# Patient Record
Sex: Female | Born: 1970 | Race: White | Hispanic: No | Smoking: Never smoker
Health system: Southern US, Community
[De-identification: ages and names within clinical notes are randomized; demographics above are authoritative.]

## PROBLEM LIST (undated history)

## (undated) DIAGNOSIS — E785 Hyperlipidemia, unspecified: Secondary | ICD-10-CM

## (undated) DIAGNOSIS — M1990 Unspecified osteoarthritis, unspecified site: Secondary | ICD-10-CM

## (undated) DIAGNOSIS — K219 Gastro-esophageal reflux disease without esophagitis: Secondary | ICD-10-CM

## (undated) DIAGNOSIS — T7840XA Allergy, unspecified, initial encounter: Secondary | ICD-10-CM

## (undated) DIAGNOSIS — I10 Essential (primary) hypertension: Secondary | ICD-10-CM

## (undated) HISTORY — DX: Gastro-esophageal reflux disease without esophagitis: K21.9

## (undated) HISTORY — DX: Hyperlipidemia, unspecified: E78.5

## (undated) HISTORY — DX: Essential (primary) hypertension: I10

## (undated) HISTORY — DX: Unspecified osteoarthritis, unspecified site: M19.90

## (undated) HISTORY — DX: Allergy, unspecified, initial encounter: T78.40XA

---

## 1998-11-22 ENCOUNTER — Other Ambulatory Visit: Admission: RE | Admit: 1998-11-22 | Discharge: 1998-11-22 | Payer: Self-pay | Admitting: Obstetrics and Gynecology

## 1999-11-28 ENCOUNTER — Other Ambulatory Visit: Admission: RE | Admit: 1999-11-28 | Discharge: 1999-11-28 | Payer: Self-pay | Admitting: Obstetrics and Gynecology

## 2001-01-09 ENCOUNTER — Other Ambulatory Visit: Admission: RE | Admit: 2001-01-09 | Discharge: 2001-01-09 | Payer: Self-pay | Admitting: Obstetrics and Gynecology

## 2002-01-13 ENCOUNTER — Other Ambulatory Visit: Admission: RE | Admit: 2002-01-13 | Discharge: 2002-01-13 | Payer: Self-pay | Admitting: Obstetrics and Gynecology

## 2003-11-02 ENCOUNTER — Inpatient Hospital Stay (HOSPITAL_COMMUNITY): Admission: AD | Admit: 2003-11-02 | Discharge: 2003-11-02 | Payer: Self-pay | Admitting: Obstetrics and Gynecology

## 2003-11-04 ENCOUNTER — Inpatient Hospital Stay (HOSPITAL_COMMUNITY): Admission: AD | Admit: 2003-11-04 | Discharge: 2003-11-04 | Payer: Self-pay | Admitting: Obstetrics and Gynecology

## 2003-11-07 ENCOUNTER — Inpatient Hospital Stay (HOSPITAL_COMMUNITY): Admission: AD | Admit: 2003-11-07 | Discharge: 2003-11-07 | Payer: Self-pay | Admitting: Obstetrics and Gynecology

## 2003-11-14 ENCOUNTER — Inpatient Hospital Stay (HOSPITAL_COMMUNITY): Admission: AD | Admit: 2003-11-14 | Discharge: 2003-11-19 | Payer: Self-pay | Admitting: Obstetrics and Gynecology

## 2003-12-17 HISTORY — PX: CHOLECYSTECTOMY: SHX55

## 2003-12-21 ENCOUNTER — Other Ambulatory Visit: Admission: RE | Admit: 2003-12-21 | Discharge: 2003-12-21 | Payer: Self-pay | Admitting: Obstetrics and Gynecology

## 2004-03-28 ENCOUNTER — Encounter: Admission: RE | Admit: 2004-03-28 | Discharge: 2004-03-28 | Payer: Self-pay | Admitting: Family Medicine

## 2004-03-28 ENCOUNTER — Inpatient Hospital Stay (HOSPITAL_COMMUNITY): Admission: EM | Admit: 2004-03-28 | Discharge: 2004-04-04 | Payer: Self-pay | Admitting: Emergency Medicine

## 2004-04-17 ENCOUNTER — Ambulatory Visit (HOSPITAL_COMMUNITY): Admission: RE | Admit: 2004-04-17 | Discharge: 2004-04-17 | Payer: Self-pay | Admitting: General Surgery

## 2005-01-29 ENCOUNTER — Other Ambulatory Visit: Admission: RE | Admit: 2005-01-29 | Discharge: 2005-01-29 | Payer: Self-pay | Admitting: Obstetrics and Gynecology

## 2011-06-25 ENCOUNTER — Other Ambulatory Visit: Payer: Self-pay | Admitting: Obstetrics and Gynecology

## 2014-03-09 ENCOUNTER — Other Ambulatory Visit: Payer: Self-pay | Admitting: Urology

## 2014-03-09 DIAGNOSIS — N361 Urethral diverticulum: Secondary | ICD-10-CM

## 2014-03-17 ENCOUNTER — Ambulatory Visit (HOSPITAL_COMMUNITY)
Admission: RE | Admit: 2014-03-17 | Discharge: 2014-03-17 | Disposition: A | Discharge status: Home / Self Care | Payer: BC Managed Care – PPO | Source: Ambulatory Visit | Attending: Urology | Admitting: Urology

## 2014-03-17 ENCOUNTER — Other Ambulatory Visit: Payer: Self-pay | Admitting: Urology

## 2014-03-17 DIAGNOSIS — N361 Urethral diverticulum: Secondary | ICD-10-CM | POA: Insufficient documentation

## 2014-03-17 DIAGNOSIS — F40298 Other specified phobia: Secondary | ICD-10-CM | POA: Insufficient documentation

## 2014-08-15 ENCOUNTER — Ambulatory Visit (INDEPENDENT_AMBULATORY_CARE_PROVIDER_SITE_OTHER): Payer: BC Managed Care – PPO | Admitting: Family Medicine

## 2014-08-15 ENCOUNTER — Encounter: Payer: Self-pay | Admitting: Family Medicine

## 2014-08-15 VITALS — BP 130/95 | Ht 65.0 in | Wt 243.0 lb

## 2014-08-15 DIAGNOSIS — I10 Essential (primary) hypertension: Secondary | ICD-10-CM

## 2014-08-15 DIAGNOSIS — M722 Plantar fascial fibromatosis: Secondary | ICD-10-CM

## 2014-08-15 DIAGNOSIS — M76829 Posterior tibial tendinitis, unspecified leg: Secondary | ICD-10-CM

## 2014-08-15 DIAGNOSIS — E669 Obesity, unspecified: Secondary | ICD-10-CM

## 2014-08-15 DIAGNOSIS — M76821 Posterior tibial tendinitis, right leg: Secondary | ICD-10-CM

## 2014-08-15 DIAGNOSIS — M25579 Pain in unspecified ankle and joints of unspecified foot: Secondary | ICD-10-CM

## 2014-08-15 DIAGNOSIS — M25571 Pain in right ankle and joints of right foot: Secondary | ICD-10-CM

## 2014-08-16 DIAGNOSIS — I10 Essential (primary) hypertension: Secondary | ICD-10-CM | POA: Insufficient documentation

## 2014-08-16 DIAGNOSIS — M25579 Pain in unspecified ankle and joints of unspecified foot: Secondary | ICD-10-CM | POA: Insufficient documentation

## 2014-08-16 DIAGNOSIS — M722 Plantar fascial fibromatosis: Secondary | ICD-10-CM | POA: Insufficient documentation

## 2014-08-16 DIAGNOSIS — M76821 Posterior tibial tendinitis, right leg: Secondary | ICD-10-CM | POA: Insufficient documentation

## 2014-08-16 NOTE — Progress Notes (Signed)
Patient ID: Molly Harvey, female   DOB: Jan 26, 1971, 43 y.o.   MRN: [id number]  Molly Harvey - 43 y.o. female MRN [id number]  Date of birth: [birth year]    SUBJECTIVE:     Patient sent here for evaluation for custom molded orthotics. She is seen the orthopedist for ankle and foot pain. She has bilateral plantar fasciitis diagnosed in on the right she's been diagnosed with right posterior tibialis tendinitis with a possible tear. She is currently in a fracture walker boot and will be in the next 2-3 weeks area  She has had long-standing bilateral plantar fascia type pain it's been worse in the last 6 months. 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 ROS:     No unusual weight change, no numbness in her feet or legs. No swelling in her lower extremities no calf pain. No fever, sweats, chills.  PERTINENT  PMH / PSH FH / / SH:  Past Medical, Surgical, Social, and Family History Reviewed & Updated in the EMR.  Pertinent findings include:  History of hypertension and then she's on 2 blood pressure medicines for that Obesity Pes planus Past surgical history includes cholecystectomy and C-section  OBJECTIVE: BP 130/95  Ht  (1.651 m)  Wt 243 lb (110.224 kg)  BMI 40.44 kg/m2  Physical Exam:  Vital signs are reviewed. GENERAL: Well-developed obese female no acute distress ANKLES: Bilaterally full range of motion with normal anterior drawer. The right posterior tibial tendon is mildly tender to palpation but does not sublux on active range of motion. FEET: Significant pes planus. The right foot has medial foot collapse inward. Right greater than left with bilateral plantar fascial origin tenderness to palpation. GAIT: Normal stride length.  Patient was fitted for a : standard, cushioned, semi-rigid orthotic. The orthotic was heated, placed on the orthotic stand. The patient was positioned in subtalar neutral position and 10  degrees of ankle dorsiflexion in a weight bearing stance on the heated orthotic blank After completion of molding, a stable base was applied to the orthotic blank. The blank was ground to a stable position for weight bearing. Blank: Red Base: White F.4 Posting: None  Face to face time spent in evaluation, measurement and manufacture of custom molded orthotic was 40 minutes.   ASSESSMENT & PLAN:  See problem based charting & AVS for pt instructions.

## 2016-02-20 IMAGING — MR MR PELVIS W/O CM
4 series · 19 of 48 positions shown · non-contrast
Comparison: None.

CLINICAL DATA: Evaluate urethral diverticulum.

EXAM:
MRI PELVIS WITHOUT CONTRAST
TECHNIQUE: Multiplanar multisequence MR imaging of the pelvis was performed. No
intravenous contrast was administered. Became and claustrophobic
during exam was ankle to continue. Only 4 series performed.

[Series 3: T2 · sagittal · 5.0mm · 0.47mm/px · 10 of 30 slices shown (1 of 2)]
[im 1/30]
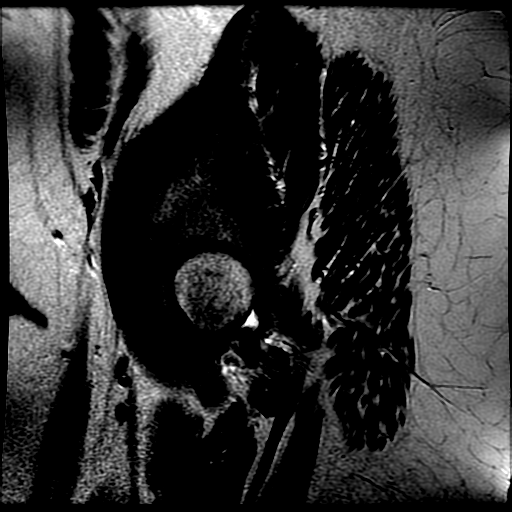
[im 3/30]
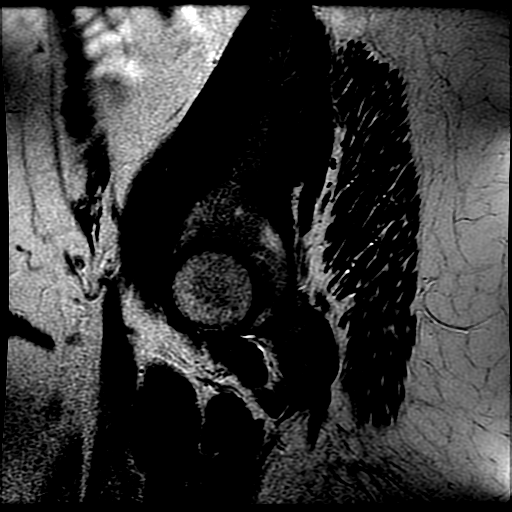
[im 6/30]
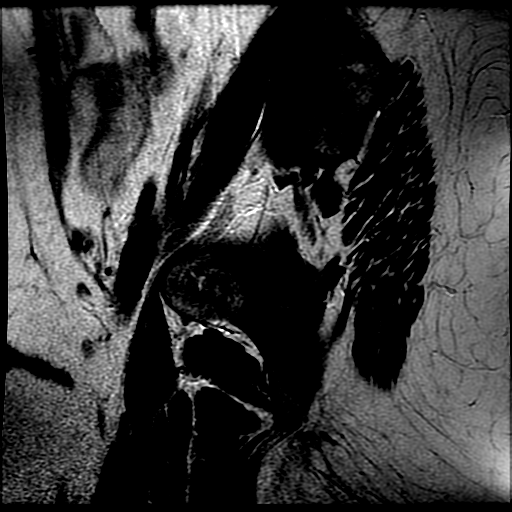
[im 9/30]
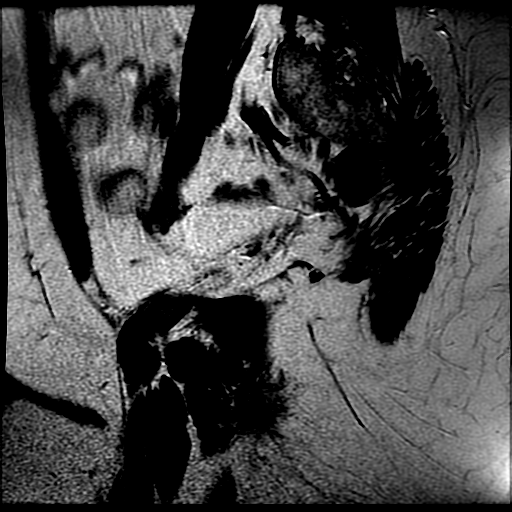
[im 12/30]
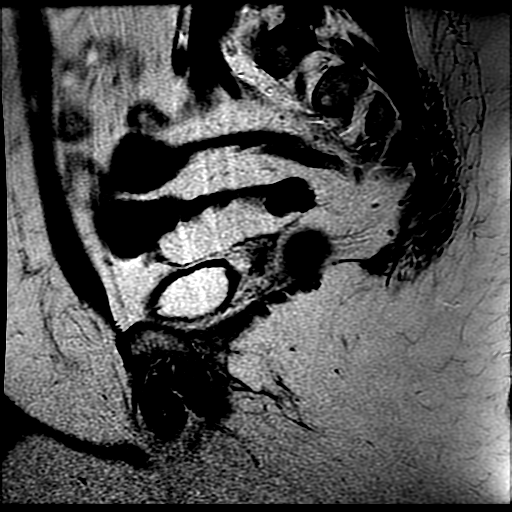
[im 15/30]
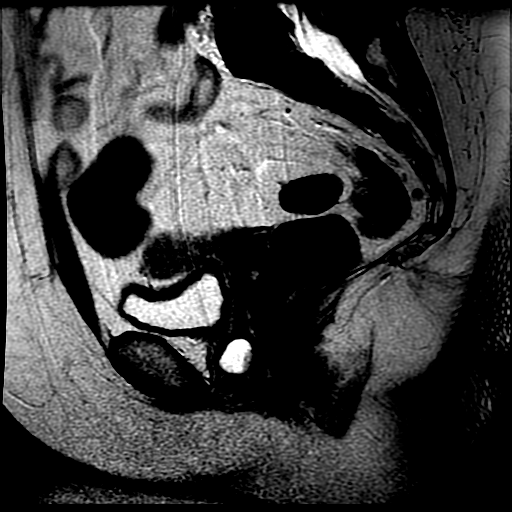
[im 18/30]
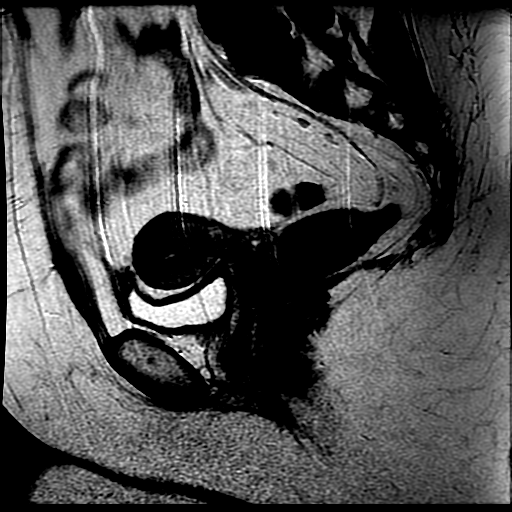
[im 21/30]
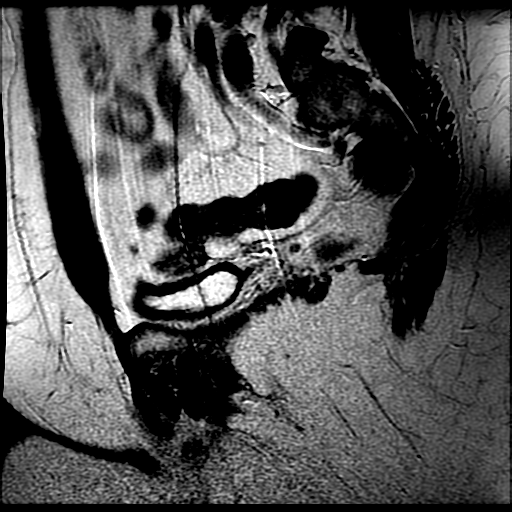
[im 24/30]
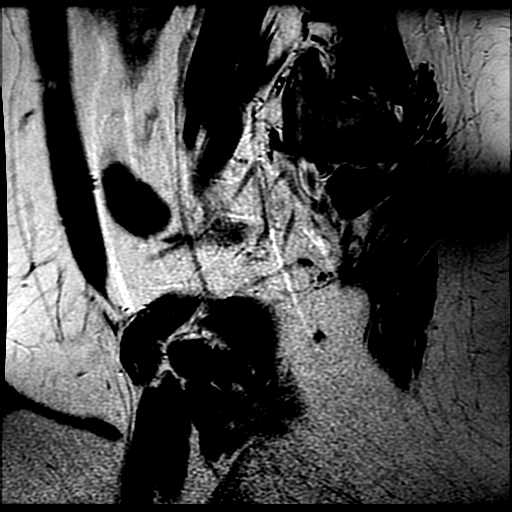
[im 27/30]
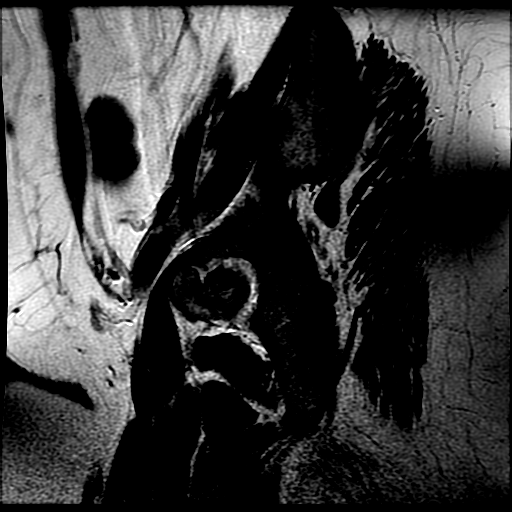

[Series 4: T2 · axial · 5.0mm · 0.47mm/px · z∈[-123,+21]mm · 3 of 34 slices shown (2 of 2)]
[im 7/34]
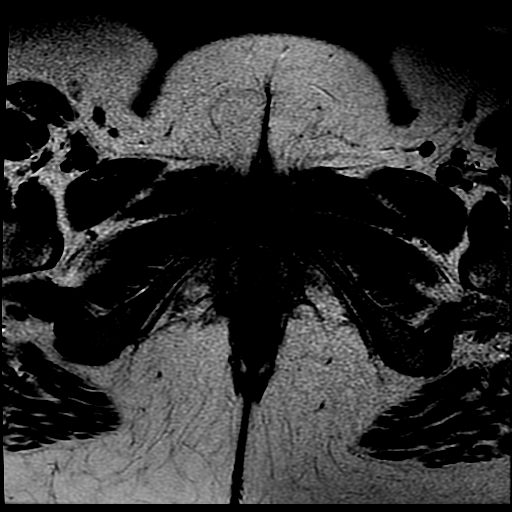
[im 19/34]
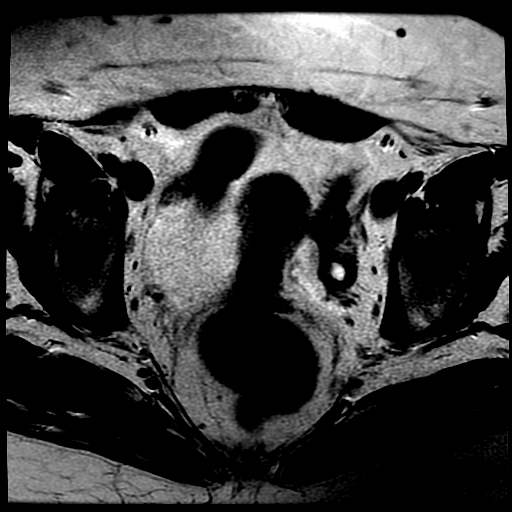
[im 31/34]
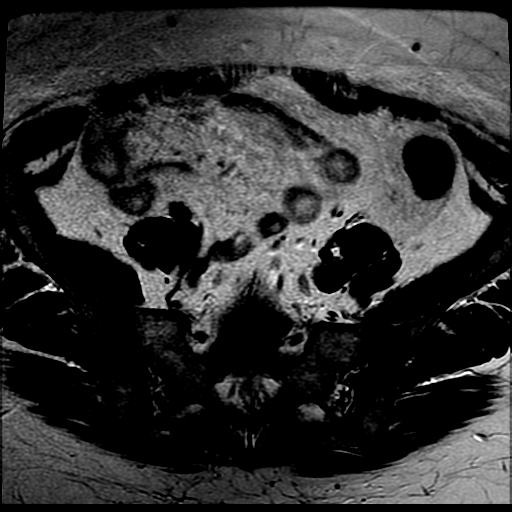

[Series 5: T2 fat-sat · axial · 5.0mm · 0.47mm/px · z∈[-123,+21]mm · 3 of 34 slices shown (1 of 2)]
[im 7/34]
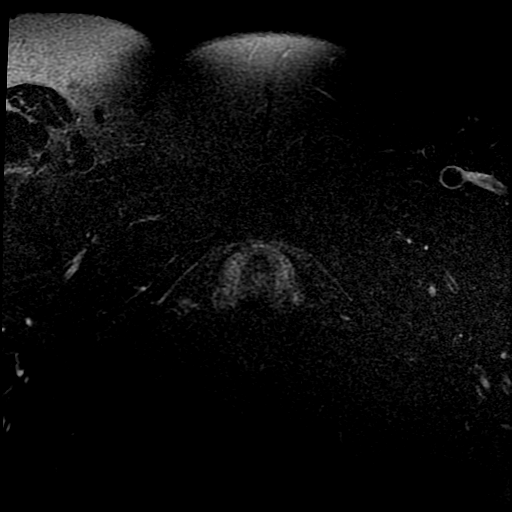
[im 19/34]
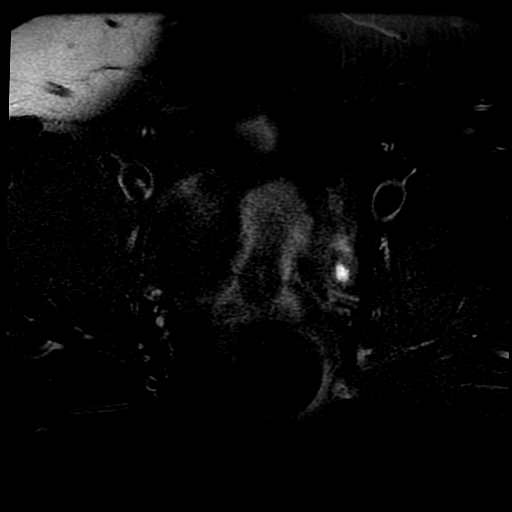
[im 31/34]
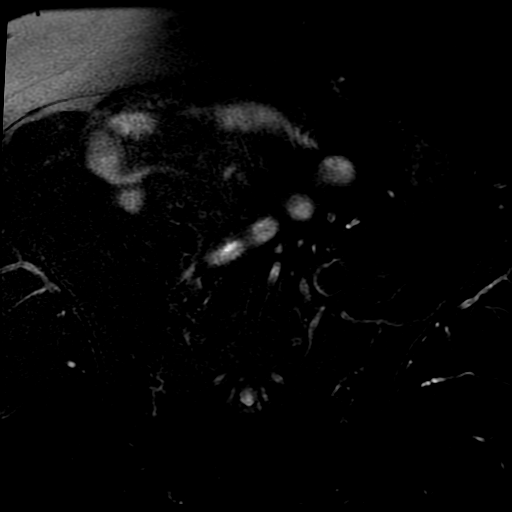

[Series 6: T2 fat-sat · coronal · 5.0mm · 0.39mm/px · 3 of 35 slices shown (2 of 2)]
[im 6/35]
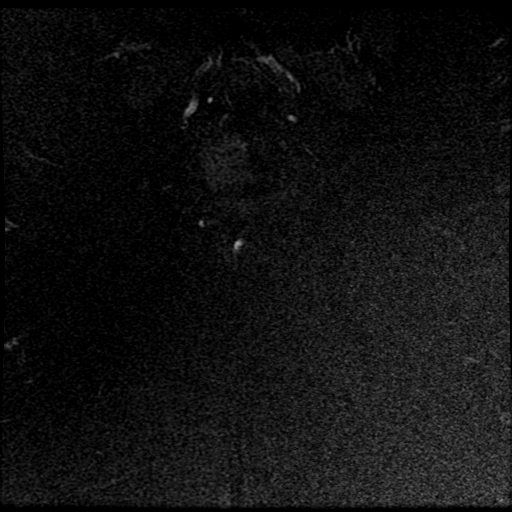
[im 18/35]
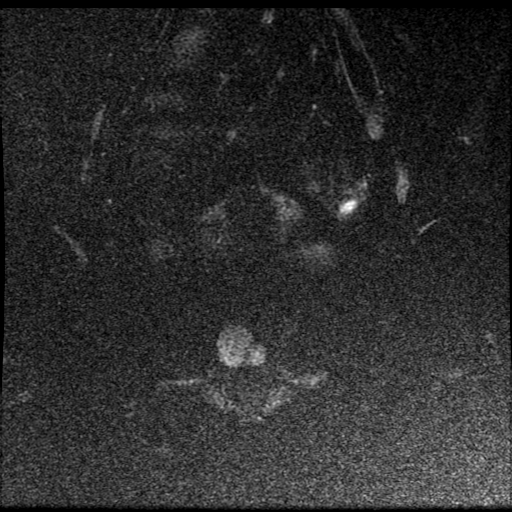
[im 29/35]
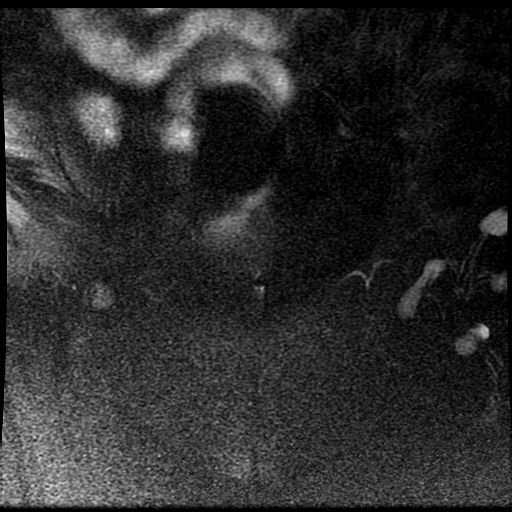

[19 of 48 positions shown; findings below may reference images not displayed]

FINDINGS: Became claustrophobic during exam was unable to continue. Only 4
series performed.

There is a saddle bag type diverticulum extending posterior to the
mid urethra from 3 o'clock to 10 o'clock. This diverticulum measures
19 mm x 15 mm in axial dimension and 14 mm in craniocaudad
dimension. There may be a small amount of debris dependent within
the diverticulum. The urethra otherwise appears normal. The bladder
is normal. No contrast series was performed.

The uterus is anteverted. There is no pelvic lymphadenopathy. Rectum
appears normal. There are several diverticular of the sigmoid colon.
IMPRESSION: 1. Saddle bag type diverticulum posterior to the mid urethra.
2. Potential small amount of debris within the diverticulum.

## 2016-06-26 ENCOUNTER — Other Ambulatory Visit: Payer: Self-pay | Admitting: Obstetrics and Gynecology

## 2016-06-27 LAB — CYTOLOGY - PAP

## 2020-10-12 ENCOUNTER — Encounter: Payer: Self-pay | Admitting: Gastroenterology

## 2020-11-23 ENCOUNTER — Other Ambulatory Visit: Payer: Self-pay

## 2020-11-23 ENCOUNTER — Ambulatory Visit (AMBULATORY_SURGERY_CENTER): Payer: Self-pay

## 2020-11-23 VITALS — Ht 65.0 in | Wt 240.0 lb

## 2020-11-23 DIAGNOSIS — Z1211 Encounter for screening for malignant neoplasm of colon: Secondary | ICD-10-CM

## 2020-11-23 MED ORDER — SUTAB 1479-225-188 MG PO TABS: 12.0000 | ORAL_TABLET | ORAL | 0 refills | Status: DC

## 2020-11-23 NOTE — Progress Notes (Signed)
No egg or soy allergy known to patient  No issues with past sedation with any surgeries or procedures no intubation problems in the past  No FH of Malignant Hyperthermia No diet pills per patient No home 02 use per patient  No blood thinners per patient  Pt denies issues with constipation  No A fib or A flutter  EMMI video to pt or via MyChart  COVID 19 guidelines implemented in [facility] today with Pt and RN   SUTAB Coupon given to pt in [facility] today , Code to Pharmacy   Due to the COVID-19 pandemic we are asking patients to follow these guidelines. Please only bring one care partner. Please be aware that your care partner may wait in the car in the parking lot or if they feel like they will be too hot to wait in the car, they may wait in the lobby on the 4th floor. All care partners are required to wear a mask the entire time (we do not have any that we can provide them), they need to practice social distancing, and we will do a Covid check for all patient's and care partners when you arrive. Also we will check their temperature and your temperature. If the care partner waits in their car they need to stay in the parking lot the entire time and we will call them on their cell phone when the patient is ready for discharge so they can bring the car to the front of the building. Also all patient's will need to wear a mask into building.  

## 2020-11-30 ENCOUNTER — Encounter: Payer: Self-pay | Admitting: Gastroenterology

## 2020-12-04 ENCOUNTER — Other Ambulatory Visit: Payer: Self-pay | Admitting: Gastroenterology

## 2020-12-04 LAB — SARS CORONAVIRUS 2 (TAT 6-24 HRS): SARS Coronavirus 2: NEGATIVE

## 2020-12-05 ENCOUNTER — Telehealth: Payer: Self-pay

## 2020-12-05 NOTE — Telephone Encounter (Signed)
Pt is scheduled to see Dr. Lavon Paganini on 12/07/20 at 1030. Dr. Lavon Paganini is doubled booked at 10am. Left VM to see if patient is interested in coming for the 0800 procedure time.

## 2020-12-07 ENCOUNTER — Ambulatory Visit (AMBULATORY_SURGERY_CENTER): Payer: BC Managed Care – PPO | Admitting: Gastroenterology

## 2020-12-07 ENCOUNTER — Encounter: Payer: Self-pay | Admitting: Gastroenterology

## 2020-12-07 ENCOUNTER — Other Ambulatory Visit: Payer: Self-pay

## 2020-12-07 VITALS — BP 112/71 | HR 56 | Temp 97.3°F | Resp 22 | Ht 65.0 in | Wt 240.0 lb

## 2020-12-07 DIAGNOSIS — Z1211 Encounter for screening for malignant neoplasm of colon: Secondary | ICD-10-CM | POA: Diagnosis present

## 2020-12-07 MED ORDER — SODIUM CHLORIDE 0.9 % IV SOLN: 500.0000 mL | Freq: Once | INTRAVENOUS | Status: DC

## 2020-12-07 NOTE — Op Note (Addendum)
[facility] Patient Name: Molly Harvey Procedure Date: 12/07/2020 11:09 AM MRN: [id number] Endoscopist: Napoleon Form , MD Age: 49 Referring MD:  Date of Birth: 1971-07-16 Gender: Female Account #: [id number] Procedure:                Colonoscopy Indications:              Screening for colorectal malignant neoplasm Medicines:                Monitored Anesthesia Care Procedure:                Pre-Anesthesia Assessment:                           - Prior to the procedure, a History and Physical                            was performed, and patient medications and                            allergies were reviewed. The patient's tolerance of                            previous anesthesia was also reviewed. The risks                            and benefits of the procedure and the sedation                            options and risks were discussed with the patient.                            All questions were answered, and informed consent                            was obtained. Prior Anticoagulants: The patient has                            taken no previous anticoagulant or antiplatelet                            agents. ASA Grade Assessment: II - A patient with                            mild systemic disease. After reviewing the risks                            and benefits, the patient was deemed in                            satisfactory condition to undergo the procedure.                           After obtaining informed consent, the colonoscope  was passed under direct vision. Throughout the                            procedure, the patient's blood pressure, pulse, and                            oxygen saturations were monitored continuously. The                            Olympus PFC-[id number]) Colonoscope was                            introduced through the anus and advanced to the the                            cecum,  identified by appendiceal orifice and                            ileocecal valve. The colonoscopy was performed                            without difficulty. The patient tolerated the                            procedure well. The quality of the bowel                            preparation was excellent. The ileocecal valve,                            appendiceal orifice, and rectum were photographed                            but not captured in Provation due to                            technical/software error. . Scope In: 11:25:25 AM Scope Out: 11:40:10 AM Scope Withdrawal Time: 0 hours 4 minutes 53 seconds  Total Procedure Duration: 0 hours 14 minutes 45 seconds  Findings:                 The perianal and digital rectal examinations were                            normal.                           Scattered small-mouthed diverticula were found in                            the sigmoid colon, descending colon and ascending                            colon.  Non-bleeding internal hemorrhoids were found during                            retroflexion. The hemorrhoids were small. Complications:            No immediate complications. Estimated Blood Loss:     Estimated blood loss was minimal. Impression:               - Diverticulosis in the sigmoid colon, in the                            descending colon and in the ascending colon.                           - Non-bleeding internal hemorrhoids.                           - No specimens collected. Recommendation:           - Patient has a contact number available for                            emergencies. The signs and symptoms of potential                            delayed complications were discussed with the                            patient. Return to normal activities tomorrow.                            Written discharge instructions were provided to the                            patient.                            - Resume previous diet.                           - Continue present medications.                           - Repeat colonoscopy in 10 years for screening                            purposes. Napoleon Form, MD 12/07/2020 11:44:04 AM This report has been signed electronically.

## 2020-12-07 NOTE — Progress Notes (Signed)
Report to PACU, RN, vss, BBS= Clear.  

## 2020-12-07 NOTE — Progress Notes (Signed)
Vitals-NS  Pt's states no medical or surgical changes since previsit or office visit. 

## 2020-12-07 NOTE — Patient Instructions (Signed)
YOU HAD AN ENDOSCOPIC PROCEDURE TODAY AT THE [facility]:   Refer to the procedure report that was given to you for any specific questions about what was found during the examination.  If the procedure report does not answer your questions, please call your gastroenterologist to clarify.  If you requested that your care partner not be given the details of your procedure findings, then the procedure report has been included in a sealed envelope for you to review at your convenience later.  YOU SHOULD EXPECT: Some feelings of bloating in the abdomen. Passage of more gas than usual.  Walking can help get rid of the air that was put into your GI tract during the procedure and reduce the bloating. If you had a lower endoscopy (such as a colonoscopy or flexible sigmoidoscopy) you may notice spotting of blood in your stool or on the toilet paper. If you underwent a bowel prep for your procedure, you may not have a normal bowel movement for a few days.  Please Note:  You might notice some irritation and congestion in your nose or some drainage.  This is from the oxygen used during your procedure.  There is no need for concern and it should clear up in a day or so.  SYMPTOMS TO REPORT IMMEDIATELY:   Following lower endoscopy (colonoscopy or flexible sigmoidoscopy):  Excessive amounts of blood in the stool  Significant tenderness or worsening of abdominal pains  Swelling of the abdomen that is new, acute  Fever of 100F or higher   Following upper endoscopy (EGD)  Vomiting of blood or coffee ground material  New chest pain or pain under the shoulder blades  Painful or persistently difficult swallowing  New shortness of breath  Fever of 100F or higher  Black, tarry-looking stools  For urgent or emergent issues, a gastroenterologist can be reached at any hour by calling [phone]. Do not use MyChart messaging for urgent concerns.    DIET:  We do recommend a small meal at first, but  then you may proceed to your regular diet.  Drink plenty of fluids but you should avoid alcoholic beverages for 24 hours.  ACTIVITY:  You should plan to take it easy for the rest of today and you should NOT DRIVE or use heavy machinery until tomorrow (because of the sedation medicines used during the test).    FOLLOW UP: Our staff will call the number listed on your records 48-72 hours following your procedure to check on you and address any questions or concerns that you may have regarding the information given to you following your procedure. If we do not reach you, we will leave a message.  We will attempt to reach you two times.  During this call, we will ask if you have developed any symptoms of COVID 19. If you develop any symptoms (ie: fever, flu-like symptoms, shortness of breath, cough etc.) before then, please call [phone].  If you test positive for Covid 19 in the 2 weeks post procedure, please call and report this information to us.    If any biopsies were taken you will be contacted by phone or by letter within the next 1-3 weeks.  Please call us at [phone] if you have not heard about the biopsies in 3 weeks.    SIGNATURES/CONFIDENTIALITY: You and/or your care partner have signed paperwork which will be entered into your electronic medical record.  These signatures attest to the fact that that the information above on   your After Visit Summary has been reviewed and is understood.  Full responsibility of the confidentiality of this discharge information lies with you and/or your care-partner. 

## 2020-12-11 ENCOUNTER — Telehealth: Payer: Self-pay

## 2020-12-11 NOTE — Telephone Encounter (Signed)
LVM
